# Patient Record
Sex: Female | Born: 1990 | Race: Black or African American | Hispanic: No | Marital: Single | Smoking: Never smoker
Health system: Southern US, Community
[De-identification: ages and names within clinical notes are randomized; demographics above are authoritative.]

## PROBLEM LIST (undated history)

## (undated) DIAGNOSIS — J45909 Unspecified asthma, uncomplicated: Secondary | ICD-10-CM

## (undated) HISTORY — PX: CHOLECYSTECTOMY: SHX55

---

## 2014-11-28 ENCOUNTER — Encounter: Payer: Self-pay | Admitting: "Endocrinology

## 2014-11-28 ENCOUNTER — Ambulatory Visit (INDEPENDENT_AMBULATORY_CARE_PROVIDER_SITE_OTHER): Payer: 59 | Admitting: "Endocrinology

## 2014-11-28 VITALS — BP 128/83 | HR 98 | Ht 65.5 in | Wt 231.0 lb

## 2014-11-28 DIAGNOSIS — E0590 Thyrotoxicosis, unspecified without thyrotoxic crisis or storm: Secondary | ICD-10-CM | POA: Diagnosis not present

## 2014-11-28 DIAGNOSIS — E890 Postprocedural hypothyroidism: Secondary | ICD-10-CM | POA: Insufficient documentation

## 2014-11-28 MED ORDER — PROPRANOLOL HCL 20 MG PO TABS: 20.0000 mg | ORAL_TABLET | Freq: Two times a day (BID) | ORAL | Status: DC

## 2014-11-28 NOTE — Progress Notes (Signed)
Subjective:    Patient ID: Melanie Fernandez, female    DOB: 1990/08/09,    No past medical history on file. Past Surgical History  Procedure Laterality Date  . Cholecystectomy     Social History   Social History  . Marital Status: Single    Spouse Name: N/A  . Number of Children: N/A  . Years of Education: N/A   Social History Main Topics  . Smoking status: Never Smoker   . Smokeless tobacco: None  . Alcohol Use: No  . Drug Use: No  . Sexual Activity: Not Asked   Other Topics Concern  . None   Social History Narrative  . None   Outpatient Encounter Prescriptions as of 11/28/2014  Medication Sig  . folic acid (FOLVITE) 1 MG tablet Take 1 mg by mouth daily.  . propranolol (INDERAL) 20 MG tablet Take 1 tablet (20 mg total) by mouth 2 (two) times daily.  . [DISCONTINUED] methimazole (TAPAZOLE) 5 MG tablet Take 5 mg by mouth 3 (three) times daily.  . [DISCONTINUED] metoprolol tartrate (LOPRESSOR) 25 MG tablet Take 25 mg by mouth 2 (two) times daily.   No facility-administered encounter medications on file as of 11/28/2014.   ALLERGIES: No Known Allergies VACCINATION STATUS:  There is no immunization history on file for this patient.  HPI  The patient presents today with a medical history as above, and is being seen in consultation for hyperthyroidism requested by her PMD, Dr. Damaris SchoonerSusan Dhivianathan..  The patient has been dealing with symptoms of palpitations, heat intolerance, anxiety, oligomenorrhea.  These symptoms are progressively worsening and troubling to patient.  she underwent lab test which showed significantly elevated free T4 ( 4.32 normal  ((0.45-1.63)), associated with suppressed TSH (  0.00  ( 0.34-4.82)). She was started on methimazole 5mg  po TID.  She has  family history of thyroid dysfunction  Her both parents, a grand parent, and other extended family members. She  denies personal history of goiter. Patient is not on any anti-thyroid medications nor on  any thyroid hormone supplements. Patient  is willing to proceed with appropriate work up and therapy for thyrotoxicosis.   Review of Systems  Constitutional: Positive for fatigue. Negative for unexpected weight change.  HENT: Negative for trouble swallowing and voice change.   Eyes: Negative for visual disturbance.  Respiratory: Negative for cough, shortness of breath and wheezing.   Cardiovascular: Positive for palpitations. Negative for chest pain and leg swelling.  Gastrointestinal: Negative for nausea, vomiting and diarrhea.  Endocrine: Negative for cold intolerance, heat intolerance, polydipsia, polyphagia and polyuria.  Musculoskeletal: Negative for myalgias and arthralgias.  Skin: Negative for color change, pallor, rash and wound.  Neurological: Negative for seizures and headaches.  Psychiatric/Behavioral: Positive for agitation. Negative for suicidal ideas and confusion.    Objective:    BP 128/83 mmHg  Pulse 98  Ht 5' 5.5" (1.664 m)  Wt 231 lb (104.781 kg)  BMI 37.84 kg/m2  SpO2 99%  Wt Readings from Last 3 Encounters:  11/28/14 231 lb (104.781 kg)    Physical Exam  Constitutional: She is oriented to person, place, and time. She appears well-developed.  HENT:  Head: Normocephalic and atraumatic.  Eyes: EOM are normal.  Neck: Normal range of motion. Neck supple. No tracheal deviation present. Thyromegaly present.  Cardiovascular: Normal rate and regular rhythm.   Pulmonary/Chest: Effort normal and breath sounds normal.  Abdominal: Soft. Bowel sounds are normal. There is no tenderness. There is no guarding.  Musculoskeletal:  Normal range of motion. She exhibits no edema.  Neurological: She is alert and oriented to person, place, and time. She has normal reflexes. No cranial nerve deficit. Coordination normal.  Skin: Skin is warm and dry. No rash noted. No erythema. No pallor.  Psychiatric: She has a normal mood and affect. Judgment normal.       Assessment & Plan:    1. Hyperthyroidism  Patient's history and most recent labs are reviewed. Findings are consistent with thyrotoxicosis likely from hyperthyroidism. The potential risks of untreated thyrotoxicosis and the need for definitive therapy have been discussed in detail with the patient, and the patient agrees to proceed with plan.   I like to repeat full profile thyroid function tests today and confirmatory thyroid uptake and scan will be scheduled to be done as soon as possible. She has to be off of Methimazole for 5 days before uptake and scan.   Therapy may involve RAI ablation of the thyroid, with subsequent need for lifelong thyroid hormone replacement. Pt is made aware of this fact and willing to proceed. The patient will return in 10 days for treatment decision. I  will initiate low dose Propranolol for symptomatic relief.  Follow up plan: Return in about 10 days (around 12/08/2014) for overactive thyroid.  Marquis Lunch, MD Phone: [phone]  Fax: [phone]   11/28/2014, 3:14 PM

## 2014-11-28 NOTE — Patient Instructions (Signed)
After your nuclear medicine thyroid test .

## 2014-12-08 ENCOUNTER — Encounter (HOSPITAL_COMMUNITY)
Admission: RE | Admit: 2014-12-08 | Discharge: 2014-12-08 | Disposition: A | Discharge status: Home / Self Care | Payer: 59 | Source: Ambulatory Visit | Attending: "Endocrinology | Admitting: "Endocrinology

## 2014-12-08 ENCOUNTER — Encounter (HOSPITAL_COMMUNITY): Payer: Self-pay

## 2014-12-08 DIAGNOSIS — E0590 Thyrotoxicosis, unspecified without thyrotoxic crisis or storm: Secondary | ICD-10-CM | POA: Insufficient documentation

## 2014-12-08 HISTORY — DX: Unspecified asthma, uncomplicated: J45.909

## 2014-12-08 MED ORDER — SODIUM IODIDE I 131 CAPSULE
14.0000 | Freq: Once | INTRAVENOUS | Status: AC | PRN
  Administered 2014-12-08: 14 via ORAL

## 2014-12-09 ENCOUNTER — Encounter (HOSPITAL_COMMUNITY)
Admission: RE | Admit: 2014-12-09 | Discharge: 2014-12-09 | Disposition: A | Discharge status: Home / Self Care | Payer: 59 | Source: Ambulatory Visit | Attending: "Endocrinology | Admitting: "Endocrinology

## 2014-12-09 DIAGNOSIS — E0590 Thyrotoxicosis, unspecified without thyrotoxic crisis or storm: Secondary | ICD-10-CM | POA: Diagnosis not present

## 2014-12-09 MED ORDER — SODIUM PERTECHNETATE TC 99M INJECTION
10.0000 | Freq: Once | INTRAVENOUS | Status: AC | PRN
  Administered 2014-12-09: 9.9 via INTRAVENOUS

## 2014-12-11 ENCOUNTER — Ambulatory Visit (INDEPENDENT_AMBULATORY_CARE_PROVIDER_SITE_OTHER): Payer: 59 | Admitting: "Endocrinology

## 2014-12-11 ENCOUNTER — Encounter: Payer: Self-pay | Admitting: "Endocrinology

## 2014-12-11 VITALS — BP 123/80 | HR 99 | Ht 65.0 in | Wt 242.6 lb

## 2014-12-11 DIAGNOSIS — E0590 Thyrotoxicosis, unspecified without thyrotoxic crisis or storm: Secondary | ICD-10-CM

## 2014-12-11 NOTE — Progress Notes (Signed)
Subjective:    Patient ID: Melanie Fernandez, female    DOB: 30-Dec-1990,    Past Medical History  Diagnosis Date  . Asthma    Past Surgical History  Procedure Laterality Date  . Cholecystectomy     Social History   Social History  . Marital Status: Single    Spouse Name: N/A  . Number of Children: N/A  . Years of Education: N/A   Social History Main Topics  . Smoking status: Never Smoker   . Smokeless tobacco: Never Used  . Alcohol Use: No  . Drug Use: No  . Sexual Activity: Not Asked   Other Topics Concern  . None   Social History Narrative   Outpatient Encounter Prescriptions as of 12/11/2014  Medication Sig  . folic acid (FOLVITE) 1 MG tablet Take 1 mg by mouth daily.  . propranolol (INDERAL) 20 MG tablet Take 1 tablet (20 mg total) by mouth 2 (two) times daily.   No facility-administered encounter medications on file as of 12/11/2014.   ALLERGIES: No Known Allergies VACCINATION STATUS:  There is no immunization history on file for this patient.  HPI  The patient presents today with a medical history as above. She is here to f/u after her thyroid uptake and scan.   The patient has been dealing with symptoms of palpitations, heat intolerance, anxiety, oligomenorrhea.  These symptoms are progressively worsening and troubling to patient.  she underwent lab test which showed significantly elevated free T4 ( 4.32 normal  ((0.45-1.63)), associated with suppressed TSH (  0.00  ( 0.34-4.82)). She is off MMI.  Her uptake is 68% , uniform suggesting Graves Disease.  She has  family history of thyroid dysfunction  Her both parents, a grand parent, and other extended family members. She  denies personal history of goiter. Patient is not on any anti-thyroid medications nor on any thyroid hormone supplements. Patient  is willing to proceed with appropriate  therapy for thyrotoxicosis.   Review of Systems  Constitutional: Positive for fatigue. Negative for unexpected  weight change.  HENT: Negative for trouble swallowing and voice change.   Eyes: Negative for visual disturbance.  Respiratory: Negative for cough, shortness of breath and wheezing.   Cardiovascular: Positive for palpitations. Negative for chest pain and leg swelling.  Gastrointestinal: Negative for nausea, vomiting and diarrhea.  Endocrine: Negative for cold intolerance, heat intolerance, polydipsia, polyphagia and polyuria.  Musculoskeletal: Negative for myalgias and arthralgias.  Skin: Negative for color change, pallor, rash and wound.  Neurological: Negative for seizures and headaches.  Psychiatric/Behavioral: Positive for agitation. Negative for suicidal ideas and confusion.    Objective:    BP 123/80 mmHg  Pulse 99  Ht 5\' 5"  (1.651 m)  Wt 242 lb 9.6 oz (110.043 kg)  BMI 40.37 kg/m2  SpO2 99%  LMP 11/24/2014 (Within Days)  Wt Readings from Last 3 Encounters:  12/11/14 242 lb 9.6 oz (110.043 kg)  11/28/14 231 lb (104.781 kg)    Physical Exam  Constitutional: She is oriented to person, place, and time. She appears well-developed.  HENT:  Head: Normocephalic and atraumatic.  Eyes: EOM are normal.  She has bilateral exophthalmos and lid lag.   Neck: Normal range of motion. Neck supple. No tracheal deviation present. Thyromegaly present.  Cardiovascular: Normal rate and regular rhythm.   Pulmonary/Chest: Effort normal and breath sounds normal.  Abdominal: Soft. Bowel sounds are normal. There is no tenderness. There is no guarding.  Musculoskeletal: Normal range of motion. She exhibits  no edema.  Neurological: She is alert and oriented to person, place, and time. She has normal reflexes. No cranial nerve deficit. Coordination normal.  Skin: Skin is warm and dry. No rash noted. No erythema. No pallor.  Psychiatric: She has a normal mood and affect. Judgment normal.       Assessment & Plan:   1. Hyperthyroidism Her work up confirms Grave's Disease, 68%, uniform uptake.   Findings are consistent with thyrotoxicosis likely from hyperthyroidism. The potential risks of untreated thyrotoxicosis and the need for definitive therapy have been discussed in detail with the patient, and the patient agrees to proceed with plan.   Therapy will be RAI ablation of the thyroid, with subsequent need for lifelong thyroid hormone replacement. Pt is made aware of this fact and willing to proceed. I will arrange for same . Precautions for pregnancy discussed , to avoid until 6 months after I131. The patient will return in  6 weeks with TFTs. She will continue  low dose Propranolol  po TID  for symptomatic relief.  Follow up plan: Return in about 6 weeks (around 01/22/2015) for overactive thyroid.  Marquis Lunch, MD Phone: [phone]  Fax: [phone]   12/11/2014, 4:13 PM

## 2014-12-18 ENCOUNTER — Encounter (HOSPITAL_COMMUNITY)
Admission: RE | Admit: 2014-12-18 | Discharge: 2014-12-18 | Disposition: A | Discharge status: Home / Self Care | Payer: 59 | Source: Ambulatory Visit | Attending: "Endocrinology | Admitting: "Endocrinology

## 2014-12-18 DIAGNOSIS — E0590 Thyrotoxicosis, unspecified without thyrotoxic crisis or storm: Secondary | ICD-10-CM | POA: Diagnosis not present

## 2014-12-18 LAB — I-STAT BETA HCG BLOOD, ED (NOT ORDERABLE)

## 2014-12-18 MED ORDER — SODIUM IODIDE I 131 CAPSULE
12.0000 | Freq: Once | INTRAVENOUS | Status: AC | PRN
  Administered 2014-12-18: 12 via ORAL

## 2015-01-14 ENCOUNTER — Other Ambulatory Visit (HOSPITAL_COMMUNITY)
Admission: RE | Admit: 2015-01-14 | Discharge: 2015-01-14 | Disposition: A | Discharge status: Home / Self Care | Payer: 59 | Source: Ambulatory Visit | Attending: "Endocrinology | Admitting: "Endocrinology

## 2015-01-14 DIAGNOSIS — E0590 Thyrotoxicosis, unspecified without thyrotoxic crisis or storm: Secondary | ICD-10-CM | POA: Diagnosis present

## 2015-01-14 LAB — TSH: TSH: <0.01 u[IU]/mL — ABNORMAL LOW (ref 0.350–4.500)

## 2015-01-14 LAB — T4, FREE: Free T4: 3.15 ng/dL — ABNORMAL HIGH (ref 0.61–1.12)

## 2015-01-22 ENCOUNTER — Encounter: Payer: Self-pay | Admitting: "Endocrinology

## 2015-01-22 ENCOUNTER — Ambulatory Visit (INDEPENDENT_AMBULATORY_CARE_PROVIDER_SITE_OTHER): Payer: 59 | Admitting: "Endocrinology

## 2015-01-22 VITALS — BP 137/89 | HR 101 | Ht 65.5 in | Wt 244.0 lb

## 2015-01-22 DIAGNOSIS — E0590 Thyrotoxicosis, unspecified without thyrotoxic crisis or storm: Secondary | ICD-10-CM

## 2015-01-22 DIAGNOSIS — E0500 Thyrotoxicosis with diffuse goiter without thyrotoxic crisis or storm: Secondary | ICD-10-CM

## 2015-01-22 NOTE — Progress Notes (Signed)
Subjective:    Patient ID: Melanie Fernandez, female    DOB: [birth year],    Past Medical History  Diagnosis Date  . Asthma    Past Surgical History  Procedure Laterality Date  . Cholecystectomy     Social History   Social History  . Marital Status: Single    Spouse Name: N/A  . Number of Children: N/A  . Years of Education: N/A   Social History Main Topics  . Smoking status: Never Smoker   . Smokeless tobacco: Never Used  . Alcohol Use: No  . Drug Use: No  . Sexual Activity: Not Asked   Other Topics Concern  . None   Social History Narrative   Outpatient Encounter Prescriptions as of 01/22/2015  Medication Sig  . folic acid (FOLVITE) 1 MG tablet Take 1 mg by mouth daily.  . propranolol (INDERAL) 20 MG tablet Take 1 tablet (20 mg total) by mouth 2 (two) times daily.   No facility-administered encounter medications on file as of 01/22/2015.   ALLERGIES: No Known Allergies VACCINATION STATUS:  There is no immunization history on file for this patient.  HPI  The patient presents today with a medical history as above. She is here to f/u after  RAI therapy on December 18, 2014 and labs on January 14, 2015. She feels better. She denies  palpitations, heat intolerance, anxiety.  She is off MMI.  Her uptake has been 68% , uniform suggesting Graves Disease.  She has  family history of thyroid dysfunction  Her both parents, a grand parent, and other extended family members. She  denies personal history of goiter. Patient is not on any anti-thyroid medications nor on any thyroid hormone supplements. Patient  is willing to proceed with appropriate  therapy for thyrotoxicosis.   Review of Systems  Constitutional: Negative for fatigue and unexpected weight change.  HENT: Negative for trouble swallowing and voice change.   Eyes: Negative for visual disturbance.  Respiratory: Negative for cough, shortness of breath and wheezing.   Cardiovascular: Negative for chest pain,  palpitations and leg swelling.  Gastrointestinal: Negative for nausea, vomiting and diarrhea.  Endocrine: Negative for cold intolerance, heat intolerance, polydipsia, polyphagia and polyuria.  Musculoskeletal: Negative for myalgias and arthralgias.  Skin: Negative for color change, pallor, rash and wound.  Neurological: Negative for seizures and headaches.  Psychiatric/Behavioral: Negative for suicidal ideas, confusion and agitation.    Objective:    BP 137/89 mmHg  Pulse 101  Ht 5' 5.5" (1.664 m)  Wt 244 lb (110.678 kg)  BMI 39.97 kg/m2  SpO2 100%  Wt Readings from Last 3 Encounters:  01/22/15 244 lb (110.678 kg)  12/11/14 242 lb 9.6 oz (110.043 kg)  11/28/14 231 lb (104.781 kg)    Physical Exam  Constitutional: She is oriented to person, place, and time. She appears well-developed.  HENT:  Head: Normocephalic and atraumatic.  Eyes: EOM are normal.  She has bilateral exophthalmos and lid lag.   Neck: Normal range of motion. Neck supple. No tracheal deviation present. Thyromegaly present.  Cardiovascular: Normal rate and regular rhythm.   Pulmonary/Chest: Effort normal and breath sounds normal.  Abdominal: Soft. Bowel sounds are normal. There is no tenderness. There is no guarding.  Musculoskeletal: Normal range of motion. She exhibits no edema.  Neurological: She is alert and oriented to person, place, and time. She has normal reflexes. No cranial nerve deficit. Coordination normal.  Skin: Skin is warm and dry. No rash noted. No erythema. No  pallor.  Psychiatric: She has a normal mood and affect. Judgment normal.   Recent Results (from the past 2160 hour(s))  I-Stat beta hCG blood, ED     Status: None   Collection Time: 12/18/14  8:32 AM  Result Value Ref Range   I-stat hCG, quantitative <5.0 <5 mIU/mL   Comment 3            Comment:   GEST. AGE      CONC.  (mIU/mL)   <=1 WEEK        5 - 50     2 WEEKS       50 - 500     3 WEEKS       100 - 10,000     4 WEEKS      1,000 - 30,000        FEMALE AND NON-PREGNANT FEMALE:     LESS THAN 5 mIU/mL   T4, free     Status: Abnormal   Collection Time: 01/14/15 10:37 AM  Result Value Ref Range   Free T4 3.15 (H) 0.61 - 1.12 ng/dL    Comment: [facility]  TSH     Status: Abnormal   Collection Time: 01/14/15 10:37 AM  Result Value Ref Range   TSH <0.010 (L) 0.350 - 4.500 uIU/mL      Assessment & Plan:   1. Hyperthyroidism She is s/p RAI on December 18, 2014 , labs from January 14, 2015 show that she is not hypothyroid yet. She is not ready for Thyroid hormone replacement. Unlikely to be treatment failure, she is probably late responder to RAI.   The patient will return in  6 weeks with TFTs. She will continue  low dose Propranolol 20mg  po TID  for symptomatic relief. 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  Follow up plan: Return in about 6 weeks (around 03/05/2015) for overactive thyroid.  Marquis LunchGebre Leylanie Woodmansee, MD Phone: [phone]  Fax: [phone]   01/22/2015, 8:46 PM

## 2015-03-05 ENCOUNTER — Encounter: Payer: 59 | Admitting: "Endocrinology

## 2015-03-05 ENCOUNTER — Encounter: Payer: Self-pay | Admitting: "Endocrinology

## 2015-03-06 NOTE — Progress Notes (Signed)
This encounter was created in error - please disregard.

## 2015-03-10 ENCOUNTER — Ambulatory Visit: Payer: 59 | Admitting: "Endocrinology

## 2015-03-16 ENCOUNTER — Encounter: Payer: Self-pay | Admitting: "Endocrinology

## 2015-03-16 ENCOUNTER — Ambulatory Visit (INDEPENDENT_AMBULATORY_CARE_PROVIDER_SITE_OTHER): Payer: 59 | Admitting: "Endocrinology

## 2015-03-16 VITALS — BP 138/87 | HR 96 | Ht 65.5 in | Wt 265.0 lb

## 2015-03-16 DIAGNOSIS — E0590 Thyrotoxicosis, unspecified without thyrotoxic crisis or storm: Secondary | ICD-10-CM | POA: Diagnosis not present

## 2015-03-16 DIAGNOSIS — E0500 Thyrotoxicosis with diffuse goiter without thyrotoxic crisis or storm: Secondary | ICD-10-CM

## 2015-03-16 MED ORDER — LEVOTHYROXINE SODIUM 25 MCG PO TABS: 25.0000 ug | ORAL_TABLET | Freq: Every day | ORAL | Status: DC

## 2015-03-16 NOTE — Progress Notes (Signed)
Subjective:    Patient ID: Melanie Fernandez, female    DOB: [birth year],    Past Medical History  Diagnosis Date  . Asthma    Past Surgical History  Procedure Laterality Date  . Cholecystectomy     Social History   Social History  . Marital Status: Single    Spouse Name: N/A  . Number of Children: N/A  . Years of Education: N/A   Social History Main Topics  . Smoking status: Never Smoker   . Smokeless tobacco: Never Used  . Alcohol Use: No  . Drug Use: No  . Sexual Activity: Not Asked   Other Topics Concern  . None   Social History Narrative   Outpatient Encounter Prescriptions as of 03/16/2015  Medication Sig  . folic acid (FOLVITE) 1 MG tablet Take 1 mg by mouth daily.  Marland Kitchen levothyroxine (SYNTHROID, LEVOTHROID) 25 MCG tablet Take 1 tablet (25 mcg total) by mouth daily before breakfast.  . [DISCONTINUED] propranolol (INDERAL) 20 MG tablet Take 1 tablet (20 mg total) by mouth 2 (two) times daily.   No facility-administered encounter medications on file as of 03/16/2015.   ALLERGIES: No Known Allergies VACCINATION STATUS:  There is no immunization history on file for this patient.  HPI  The patient presents today with a medical history as above. She is here to f/u after  RAI therapy on December 18, 2014 and labs on January 14, 2015. She feels better, she has gained significant amount of weight. She denies  palpitations, heat intolerance, anxiety.   Her uptake has been 68% , uniform suggesting Graves Disease. -She has not heard from the ophthalmologist we referred her to. She has  family history of thyroid dysfunction  Her both parents, a grand parent, and other extended family members. She  denies personal history of goiter. Patient is not on any anti-thyroid medications nor on any thyroid hormone supplements.   Review of Systems  Constitutional: Negative for fatigue and unexpected weight change.  HENT: Negative for trouble swallowing and voice change.   Eyes:  Negative for visual disturbance.  Respiratory: Negative for cough, shortness of breath and wheezing.   Cardiovascular: Negative for chest pain, palpitations and leg swelling.  Gastrointestinal: Negative for nausea, vomiting and diarrhea.  Endocrine: Negative for cold intolerance, heat intolerance, polydipsia, polyphagia and polyuria.  Musculoskeletal: Negative for myalgias and arthralgias.  Skin: Negative for color change, pallor, rash and wound.  Neurological: Negative for seizures and headaches.  Psychiatric/Behavioral: Negative for suicidal ideas, confusion and agitation.    Objective:    BP 138/87 mmHg  Pulse 96  Ht 5' 5.5" (1.664 m)  Wt 265 lb (120.203 kg)  BMI 43.41 kg/m2  SpO2 99%  Wt Readings from Last 3 Encounters:  03/16/15 265 lb (120.203 kg)  03/05/15 259 lb (117.482 kg)  01/22/15 244 lb (110.678 kg)    Physical Exam  Constitutional: She is oriented to person, place, and time. She appears well-developed.  HENT:  Head: Normocephalic and atraumatic.  Eyes: EOM are normal.  She has bilateral exophthalmos and lid lag.   Neck: Normal range of motion. Neck supple. No tracheal deviation present. Thyromegaly present.  Cardiovascular: Normal rate and regular rhythm.   Pulmonary/Chest: Effort normal and breath sounds normal.  Abdominal: Soft. Bowel sounds are normal. There is no tenderness. There is no guarding.  Musculoskeletal: Normal range of motion. She exhibits no edema.  Neurological: She is alert and oriented to person, place, and time. She has normal reflexes.  No cranial nerve deficit. Coordination normal.  Skin: Skin is warm and dry. No rash noted. No erythema. No pallor.  Psychiatric: She has a normal mood and affect. Judgment normal.   Recent Results (from the past 2160 hour(s))  I-Stat beta hCG blood, ED     Status: None   Collection Time: 12/18/14  8:32 AM  Result Value Ref Range   I-stat hCG, quantitative <5.0 <5 mIU/mL   Comment 3            Comment:    GEST. AGE      CONC.  (mIU/mL)   <=1 WEEK        5 - 50     2 WEEKS       50 - 500     3 WEEKS       100 - 10,000     4 WEEKS     1,000 - 30,000        FEMALE AND NON-PREGNANT FEMALE:     LESS THAN 5 mIU/mL   T4, free     Status: Abnormal   Collection Time: 01/14/15 10:37 AM  Result Value Ref Range   Free T4 3.15 (H) 0.61 - 1.12 ng/dL    Comment: Performed at [facility]  TSH     Status: Abnormal   Collection Time: 01/14/15 10:37 AM  Result Value Ref Range   TSH <0.010 (L) 0.350 - 4.500 uIU/mL   Her labs from 02/27/2015 showed TSH still suppressed at <0.006 and free T4 improved to 1.9 from 3.15.   Assessment & Plan:   1. RAI induced hypothyroidism She is s/p RAI on December 18, 2014 , labs from January 14, 2015 show that  treatment has taken effect. She is clinically hypothyroid with significant weight gain. Her free T4 decreased from 3.15 to 1.9. She will benefit from early initiation of low-dose levothyroxine. I will start with levothyroxine 25 g by mouth every morning. She will likely require more doses on subsequent visits.  - We discussed about correct intake of levothyroxine, at fasting, with water, separated by at least 30 minutes from breakfast, and separated by more than 4 hours from calcium, iron, multivitamins, acid reflux medications (PPIs). -Patient is made aware of the fact that thyroid hormone replacement is needed for life, dose to be adjusted by periodic monitoring of thyroid function tests.   The patient will return in  8 weeks with TFTs. -I will discontinue  Propranolol . -Her consult for ophthalmology for Graves' ophthalmopathy is still pending. If she does not hear a phone call until next visit,  we will refer her to [facility] ophthalmology.   Follow up plan: Return in about 8 weeks (around 05/11/2015) for underactive thyroid, follow up with pre-visit labs.  Marquis Lunch, MD Phone: [phone]  Fax: [phone]   03/16/2015, 10:25 AM

## 2015-05-11 ENCOUNTER — Ambulatory Visit: Payer: 59 | Admitting: "Endocrinology

## 2015-06-23 ENCOUNTER — Ambulatory Visit: Payer: 59 | Admitting: "Endocrinology

## 2015-07-22 ENCOUNTER — Encounter: Payer: Self-pay | Admitting: "Endocrinology

## 2015-07-22 ENCOUNTER — Ambulatory Visit (INDEPENDENT_AMBULATORY_CARE_PROVIDER_SITE_OTHER): Payer: 59 | Admitting: "Endocrinology

## 2015-07-22 VITALS — BP 137/90 | HR 78 | Ht 65.5 in | Wt 290.0 lb

## 2015-07-22 DIAGNOSIS — Z9119 Patient's noncompliance with other medical treatment and regimen: Secondary | ICD-10-CM

## 2015-07-22 DIAGNOSIS — E890 Postprocedural hypothyroidism: Secondary | ICD-10-CM | POA: Diagnosis not present

## 2015-07-22 DIAGNOSIS — Z91199 Patient's noncompliance with other medical treatment and regimen due to unspecified reason: Secondary | ICD-10-CM

## 2015-07-22 DIAGNOSIS — E0500 Thyrotoxicosis with diffuse goiter without thyrotoxic crisis or storm: Secondary | ICD-10-CM

## 2015-07-22 MED ORDER — LEVOTHYROXINE SODIUM 50 MCG PO TABS: 50.0000 ug | ORAL_TABLET | Freq: Every day | ORAL | Status: DC

## 2015-07-22 NOTE — Progress Notes (Signed)
Subjective:    Patient ID: Melanie Fernandez, female    DOB: October 28, 1990,    Past Medical History  Diagnosis Date  . Asthma    Past Surgical History  Procedure Laterality Date  . Cholecystectomy     Social History   Social History  . Marital Status: Single    Spouse Name: N/A  . Number of Children: N/A  . Years of Education: N/A   Social History Main Topics  . Smoking status: Never Smoker   . Smokeless tobacco: Never Used  . Alcohol Use: No  . Drug Use: No  . Sexual Activity: Not Asked   Other Topics Concern  . None   Social History Narrative   Outpatient Encounter Prescriptions as of 07/22/2015  Medication Sig  . folic acid (FOLVITE) 1 MG tablet Take 1 mg by mouth daily.  Marland Kitchen levothyroxine (SYNTHROID, LEVOTHROID) 50 MCG tablet Take 1 tablet (50 mcg total) by mouth daily before breakfast.  . [DISCONTINUED] levothyroxine (SYNTHROID, LEVOTHROID) 25 MCG tablet Take 1 tablet (25 mcg total) by mouth daily before breakfast.   No facility-administered encounter medications on file as of 07/22/2015.   ALLERGIES: No Known Allergies VACCINATION STATUS:  There is no immunization history on file for this patient.  HPI  The patient presents today with  Repeat thyroid function test. For a follow-up of RAI induced hypothyroidism.   She is s/p  RAI therapy on December 18, 2014  for Graves' disease.  -She was initiated on levothyroxine 25 g last visit. Tragically she decided not to take it for no reason.   - She has gained 30 pounds since February, seems to be unconcerned about that.   She denies  palpitations, heat intolerance, anxiety.   Her uptake has been 68% , uniform suggesting Graves Disease. -She has not heard from the ophthalmologist we referred her to. She has  family history of thyroid dysfunction . Her both parents, a grand parent, and other extended family members. She  denies personal history of goiter. Patient is not on any anti-thyroid medications nor on any  thyroid hormone supplements.   Review of Systems  Constitutional: Positive for unexpected weight change. Negative for fatigue.  HENT: Negative for trouble swallowing and voice change.   Eyes: Negative for visual disturbance.  Respiratory: Negative for cough, shortness of breath and wheezing.   Cardiovascular: Negative for chest pain, palpitations and leg swelling.  Gastrointestinal: Negative for nausea, vomiting and diarrhea.  Endocrine: Negative for cold intolerance, heat intolerance, polydipsia, polyphagia and polyuria.  Musculoskeletal: Negative for myalgias and arthralgias.  Skin: Negative for color change, pallor, rash and wound.  Neurological: Negative for seizures and headaches.  Psychiatric/Behavioral: Negative for suicidal ideas, confusion and agitation.    Objective:    BP 137/90 mmHg  Pulse 78  Ht 5' 5.5" (1.664 m)  Wt 290 lb (131.543 kg)  BMI 47.51 kg/m2  Wt Readings from Last 3 Encounters:  07/22/15 290 lb (131.543 kg)  03/16/15 265 lb (120.203 kg)  03/05/15 259 lb (117.482 kg)    Physical Exam  Constitutional: She is oriented to person, place, and time. She appears well-developed.  HENT:  Head: Normocephalic and atraumatic.  Eyes: EOM are normal.  She has bilateral exophthalmos and lid lag.   Neck: Normal range of motion. Neck supple. No tracheal deviation present. No thyromegaly present.  Cardiovascular: Normal rate and regular rhythm.   Pulmonary/Chest: Effort normal and breath sounds normal.  Abdominal: Soft. Bowel sounds are normal. There is no  tenderness. There is no guarding.  Musculoskeletal: Normal range of motion. She exhibits no edema.  Neurological: She is alert and oriented to person, place, and time. She has normal reflexes. No cranial nerve deficit. Coordination normal.  Skin: Skin is warm and dry. No rash noted. No erythema. No pallor.  Psychiatric: She has a normal mood and affect. Judgment normal.   - From 07/06/2015 her free T4 was 1.09  along with TSH still lagging at  0.01.  Her labs from 02/27/2015 showed TSH at <0.006 and free T4 improved to 1.9 from 3.15.   Assessment & Plan:   1. RAI induced hypothyroidism She is s/p RAI on December 18, 2014.  - She has hesitated to take the levothyroxine I prescribed for her, however she in Gaining 30 pounds since February.  - I had a long discussion with her about the need for thyroid hormone replacement , to avoid any further weight gain.  I'll approach her again with levothyroxine 50 g by mouth every morning. She agrees to take it.  - We discussed about correct intake of levothyroxine, at fasting, with water, separated by at least 30 minutes from breakfast, and separated by more than 4 hours from calcium, iron, multivitamins, acid reflux medications (PPIs). -Patient is made aware of the fact that thyroid hormone replacement is needed for life, dose to be adjusted by periodic monitoring of thyroid function tests.  -Her consult for ophthalmology for Graves' ophthalmopathy is still pending. If she does not hear a phone call until next visit,  [facility] will [facility].   Follow up plan: Return in about 3 months (around 10/22/2015) for follow up with pre-visit labs.  Marquis LunchGebre Abbie Jablon, MD Phone: [phone]  Fax: [phone]   07/22/2015, 11:51 AM

## 2015-10-16 LAB — TSH: TSH: 14.1 u[IU]/mL — AB (ref 0.41–5.90)

## 2015-10-23 ENCOUNTER — Ambulatory Visit (INDEPENDENT_AMBULATORY_CARE_PROVIDER_SITE_OTHER): Payer: 59 | Admitting: "Endocrinology

## 2015-10-23 ENCOUNTER — Encounter: Payer: Self-pay | Admitting: "Endocrinology

## 2015-10-23 VITALS — BP 133/84 | HR 80 | Ht 65.5 in | Wt 305.0 lb

## 2015-10-23 DIAGNOSIS — E890 Postprocedural hypothyroidism: Secondary | ICD-10-CM

## 2015-10-23 MED ORDER — LEVOTHYROXINE SODIUM 150 MCG PO TABS: 150.0000 ug | ORAL_TABLET | Freq: Every day | ORAL | 3 refills | Status: DC

## 2015-10-23 NOTE — Progress Notes (Signed)
Subjective:    Patient ID: Melanie Fernandez, female    DOB: 08-12-1990,    Past Medical History:  Diagnosis Date  . Asthma    Past Surgical History:  Procedure Laterality Date  . CHOLECYSTECTOMY     Social History   Social History  . Marital status: Single    Spouse name: N/A  . Number of children: N/A  . Years of education: N/A   Social History Main Topics  . Smoking status: Never Smoker  . Smokeless tobacco: Never Used  . Alcohol use No  . Drug use: No  . Sexual activity: Not Asked   Other Topics Concern  . None   Social History Narrative  . None   Outpatient Encounter Prescriptions as of 10/23/2015  Medication Sig  . folic acid (FOLVITE) 1 MG tablet Take 1 mg by mouth daily.  Marland Kitchen. levothyroxine (SYNTHROID, LEVOTHROID) 150 MCG tablet Take 1 tablet (150 mcg total) by mouth daily before breakfast.  . [DISCONTINUED] levothyroxine (SYNTHROID, LEVOTHROID) 50 MCG tablet Take 1 tablet (50 mcg total) by mouth daily before breakfast.   No facility-administered encounter medications on file as of 10/23/2015.    ALLERGIES: No Known Allergies VACCINATION STATUS:  There is no immunization history on file for this patient.  HPI  The patient presents today with  Repeat thyroid function test. For a follow-up of RAI induced hypothyroidism.   Melanie Fernandez is s/p  RAI therapy on December 18, 2014  for Graves' disease.  - Melanie Fernandez is now taking her rx for 50 mcg of LT4.  - Melanie Fernandez has gained 15 more lbs since last visit.    Melanie Fernandez denies  palpitations, heat intolerance, anxiety.   Her uptake has been 68% , uniform suggesting Graves Disease.  Melanie Fernandez has  family history of thyroid dysfunction . Her both parents, a grand parent, and other extended family members. Melanie Fernandez  denies personal history of goiter. Patient is not on any anti-thyroid medications nor on any thyroid hormone supplements.   Review of Systems  Constitutional: Negative for fatigue.  HENT: Negative for trouble swallowing and voice  change.   Eyes: Negative for visual disturbance.  Respiratory: Negative for cough, shortness of breath and wheezing.   Cardiovascular: Negative for chest pain, palpitations and leg swelling.  Gastrointestinal: Negative for diarrhea, nausea and vomiting.  Endocrine: Negative for cold intolerance, heat intolerance, polydipsia, polyphagia and polyuria.  Musculoskeletal: Negative for arthralgias and myalgias.  Skin: Negative for color change, pallor, rash and wound.  Neurological: Negative for seizures and headaches.  Psychiatric/Behavioral: Negative for agitation, confusion and suicidal ideas.    Objective:    BP 133/84   Pulse 80   Ht 5' 5.5" (1.664 m)   Wt (!) 305 lb (138.3 kg)   BMI 49.98 kg/m   Wt Readings from Last 3 Encounters:  10/23/15 (!) 305 lb (138.3 kg)  07/22/15 290 lb (131.5 kg)  03/16/15 265 lb (120.2 kg)    Physical Exam  Constitutional: Melanie Fernandez is oriented to person, place, and time. Melanie Fernandez appears well-developed.  HENT:  Head: Normocephalic and atraumatic.  Eyes: EOM are normal.  Melanie Fernandez has bilateral exophthalmos and lid lag -which seems to be improving.   Neck: Normal range of motion. Neck supple. No tracheal deviation present. No thyromegaly present.  Cardiovascular: Normal rate and regular rhythm.   Pulmonary/Chest: Effort normal and breath sounds normal.  Abdominal: Soft. Bowel sounds are normal. There is no tenderness. There is no guarding.  Musculoskeletal: Normal range of motion. Melanie Fernandez  exhibits no edema.  Neurological: Melanie Fernandez is alert and oriented to person, place, and time. Melanie Fernandez has normal reflexes. No cranial nerve deficit. Coordination normal.  Skin: Skin is warm and dry. No rash noted. No erythema. No pallor.  Psychiatric: Melanie Fernandez has a normal mood and affect. Judgment normal.    Recent Results (from the past 2160 hour(s))  TSH     Status: Abnormal   Collection Time: 10/16/15 12:00 AM  Result Value Ref Range   TSH 14.10 (A) 0.41 - 5.90 uIU/mL    Comment: free t4  0.81      Assessment & Plan:   1. RAI induced hypothyroidism Melanie Fernandez is s/p RAI on December 18, 2014.  - Melanie Fernandez is more consistent taking her thyroid hormone this time. Based on her labs Melanie Fernandez would need a lot more thyroid hormone replacement. I will increase her levothyroxine to 150 g by mouth every morning.  - We discussed about correct intake of levothyroxine, at fasting, with water, separated by at least 30 minutes from breakfast, and separated by more than 4 hours from calcium, iron, multivitamins, acid reflux medications (PPIs). -Patient is made aware of the fact that thyroid hormone replacement is needed for life, dose to be adjusted by periodic monitoring of thyroid function tests.   Follow up plan: Return in about 3 months (around 01/22/2016) for follow up with pre-visit labs.  Marquis Lunch, MD Phone: [phone]  Fax: [phone]   10/23/2015, 10:46 AM

## 2016-01-29 ENCOUNTER — Ambulatory Visit: Payer: 59 | Admitting: "Endocrinology

## 2016-03-23 ENCOUNTER — Other Ambulatory Visit: Payer: Self-pay | Admitting: "Endocrinology

## 2016-04-20 ENCOUNTER — Other Ambulatory Visit: Payer: Self-pay | Admitting: "Endocrinology

## 2016-07-08 IMAGING — NM NM THYROID IMAGING W/ UPTAKE SINGLE (24 HR)
4 series · 4 of 4 positions shown · non-contrast
Comparison: None

CLINICAL DATA: Hyperthyroidism.  Tremors and exophthalmos.

EXAM:
THYROID SCAN AND UPTAKE - 24 HOURS
TECHNIQUE: Following the per oral administration of N-0I0 sodium iodide, the
patient returned at 24 hours and uptake measurements were acquired
with the uptake probe centered on the neck. Thyroid imaging was
performed following the intravenous administration of the 7c-MMm
Pertechnetate.
RADIOPHARMACEUTICALS:  Fourteen microCuries N-0I0 sodium iodide
orally and 9.9 mCi Zechnetium-33m pertechnetate IV

[Series 1: ant w marker · 3.25mm/px · 1 of 1 slices shown]
[im 1/1]
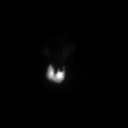

[Series 2: anterior · 3.25mm/px · 1 of 1 slices shown]
[im 1/1]
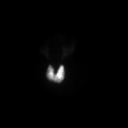

[Series 3: lao · 3.25mm/px · 1 of 1 slices shown]
[im 1/1]
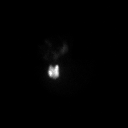

[Series 4: rao · 3.25mm/px · 1 of 1 slices shown]
[im 1/1]
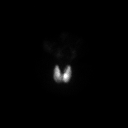

[4 of 4 positions shown; findings below may reference images not displayed]

FINDINGS: Uniform uptake within the thyroid gland. No nodularity. The gland is
mildly enlarged.

24 hour I 131 uptake = 68% (normal 10-30%)
IMPRESSION: Imaging findings and I 131 uptake consists with Graves disease.
Recommend laboratory correlation.

## 2023-02-16 ENCOUNTER — Other Ambulatory Visit: Payer: Self-pay
